# Patient Record
Sex: Male | Born: 1980 | Race: White | Hispanic: No | Marital: Married | Smoking: Never smoker
Health system: Southern US, Community
[De-identification: ages and names within clinical notes are randomized; demographics above are authoritative.]

## PROBLEM LIST (undated history)

## (undated) DIAGNOSIS — I10 Essential (primary) hypertension: Secondary | ICD-10-CM

---

## 2015-05-21 ENCOUNTER — Emergency Department (HOSPITAL_COMMUNITY): Payer: BLUE CROSS/BLUE SHIELD

## 2015-05-21 ENCOUNTER — Encounter (HOSPITAL_COMMUNITY): Payer: Self-pay

## 2015-05-21 ENCOUNTER — Emergency Department (HOSPITAL_COMMUNITY)
Admission: EM | Admit: 2015-05-21 | Discharge: 2015-05-21 | Disposition: A | Discharge status: Home / Self Care | Payer: BLUE CROSS/BLUE SHIELD | Attending: Emergency Medicine | Admitting: Emergency Medicine

## 2015-05-21 DIAGNOSIS — R1030 Lower abdominal pain, unspecified: Secondary | ICD-10-CM | POA: Diagnosis present

## 2015-05-21 DIAGNOSIS — I10 Essential (primary) hypertension: Secondary | ICD-10-CM | POA: Diagnosis not present

## 2015-05-21 DIAGNOSIS — Z79899 Other long term (current) drug therapy: Secondary | ICD-10-CM | POA: Diagnosis not present

## 2015-05-21 DIAGNOSIS — N201 Calculus of ureter: Secondary | ICD-10-CM | POA: Insufficient documentation

## 2015-05-21 HISTORY — DX: Essential (primary) hypertension: I10

## 2015-05-21 LAB — CBC WITH DIFFERENTIAL/PLATELET
BASOS PCT: 0 % (ref 0–1)
Basophils Absolute: 0 10*3/uL (ref 0.0–0.1)
EOS ABS: 0 10*3/uL (ref 0.0–0.7)
Eosinophils Relative: 0 % (ref 0–5)
HEMATOCRIT: 45.8 % (ref 39.0–52.0)
Hemoglobin: 15.8 g/dL (ref 13.0–17.0)
Lymphocytes Relative: 8 % — ABNORMAL LOW (ref 12–46)
Lymphs Abs: 1.3 10*3/uL (ref 0.7–4.0)
MCH: 30.2 pg (ref 26.0–34.0)
MCHC: 34.5 g/dL (ref 30.0–36.0)
MCV: 87.6 fL (ref 78.0–100.0)
MONO ABS: 0.8 10*3/uL (ref 0.1–1.0)
MONOS PCT: 5 % (ref 3–12)
Neutro Abs: 15.1 10*3/uL — ABNORMAL HIGH (ref 1.7–7.7)
Neutrophils Relative %: 87 % — ABNORMAL HIGH (ref 43–77)
Platelets: 232 10*3/uL (ref 150–400)
RBC: 5.23 MIL/uL (ref 4.22–5.81)
RDW: 12.4 % (ref 11.5–15.5)
WBC: 17.3 10*3/uL — ABNORMAL HIGH (ref 4.0–10.5)

## 2015-05-21 LAB — URINALYSIS, ROUTINE W REFLEX MICROSCOPIC
BILIRUBIN URINE: NEGATIVE
Glucose, UA: NEGATIVE mg/dL
HGB URINE DIPSTICK: NEGATIVE
KETONES UR: NEGATIVE mg/dL
Leukocytes, UA: NEGATIVE
NITRITE: NEGATIVE
PH: 6 (ref 5.0–8.0)
Protein, ur: NEGATIVE mg/dL
Specific Gravity, Urine: 1.017 (ref 1.005–1.030)
Urobilinogen, UA: 0.2 mg/dL (ref 0.0–1.0)

## 2015-05-21 LAB — I-STAT CHEM 8, ED
BUN: 22 mg/dL — ABNORMAL HIGH (ref 6–20)
CREATININE: 1.1 mg/dL (ref 0.61–1.24)
Calcium, Ion: 1.22 mmol/L (ref 1.12–1.23)
Chloride: 102 mmol/L (ref 101–111)
Glucose, Bld: 143 mg/dL — ABNORMAL HIGH (ref 65–99)
HEMATOCRIT: 49 % (ref 39.0–52.0)
HEMOGLOBIN: 16.7 g/dL (ref 13.0–17.0)
Potassium: 3.8 mmol/L (ref 3.5–5.1)
SODIUM: 140 mmol/L (ref 135–145)
TCO2: 23 mmol/L (ref 0–100)

## 2015-05-21 LAB — HEPATIC FUNCTION PANEL
ALBUMIN: 4.7 g/dL (ref 3.5–5.0)
ALT: 30 U/L (ref 17–63)
AST: 27 U/L (ref 15–41)
Alkaline Phosphatase: 67 U/L (ref 38–126)
BILIRUBIN DIRECT: 0.2 mg/dL (ref 0.1–0.5)
BILIRUBIN INDIRECT: 1 mg/dL — AB (ref 0.3–0.9)
BILIRUBIN TOTAL: 1.2 mg/dL (ref 0.3–1.2)
Total Protein: 7.7 g/dL (ref 6.5–8.1)

## 2015-05-21 LAB — AMYLASE: AMYLASE: 54 U/L (ref 28–100)

## 2015-05-21 LAB — LIPASE, BLOOD: Lipase: 17 U/L — ABNORMAL LOW (ref 22–51)

## 2015-05-21 MED ORDER — ONDANSETRON HCL 4 MG/2ML IJ SOLN
4.0000 mg | Freq: Once | INTRAMUSCULAR | Status: AC
  Administered 2015-05-21: 4 mg via INTRAVENOUS
  Filled 2015-05-21: qty 2

## 2015-05-21 MED ORDER — HYDROMORPHONE HCL 1 MG/ML IJ SOLN
1.0000 mg | Freq: Once | INTRAMUSCULAR | Status: AC
  Administered 2015-05-21: 1 mg via INTRAVENOUS
  Filled 2015-05-21: qty 1

## 2015-05-21 MED ORDER — PROMETHAZINE HCL 25 MG PO TABS: 25.0000 mg | ORAL_TABLET | Freq: Three times a day (TID) | ORAL | Status: AC | PRN

## 2015-05-21 MED ORDER — IOHEXOL 300 MG/ML  SOLN
50.0000 mL | Freq: Once | INTRAMUSCULAR | Status: AC | PRN
  Administered 2015-05-21: 50 mL via ORAL

## 2015-05-21 MED ORDER — SODIUM CHLORIDE 0.9 % IV BOLUS (SEPSIS)
1000.0000 mL | Freq: Once | INTRAVENOUS | Status: AC
  Administered 2015-05-21: 1000 mL via INTRAVENOUS

## 2015-05-21 MED ORDER — IOHEXOL 300 MG/ML  SOLN
100.0000 mL | Freq: Once | INTRAMUSCULAR | Status: AC | PRN
  Administered 2015-05-21: 100 mL via INTRAVENOUS

## 2015-05-21 MED ORDER — KETOROLAC TROMETHAMINE 30 MG/ML IJ SOLN
30.0000 mg | Freq: Once | INTRAMUSCULAR | Status: AC
  Administered 2015-05-21: 30 mg via INTRAVENOUS
  Filled 2015-05-21: qty 1

## 2015-05-21 MED ORDER — TAMSULOSIN HCL 0.4 MG PO CAPS: 0.4000 mg | ORAL_CAPSULE | Freq: Every day | ORAL | Status: AC

## 2015-05-21 MED ORDER — MORPHINE SULFATE (PF) 4 MG/ML IV SOLN
4.0000 mg | Freq: Once | INTRAVENOUS | Status: AC
  Administered 2015-05-21: 4 mg via INTRAVENOUS
  Filled 2015-05-21: qty 1

## 2015-05-21 MED ORDER — ONDANSETRON HCL 4 MG/2ML IJ SOLN
4.0000 mg | INTRAMUSCULAR | Status: AC
  Administered 2015-05-21: 4 mg via INTRAVENOUS
  Filled 2015-05-21: qty 2

## 2015-05-21 MED ORDER — OXYCODONE-ACETAMINOPHEN 5-325 MG PO TABS
1.0000 | ORAL_TABLET | ORAL | Status: AC | PRN
Start: 2015-05-21 — End: ?

## 2015-05-21 NOTE — ED Notes (Signed)
Pt complains of abdominal pain since 1230 last night, small amounts of vomiting, no diarrhea

## 2015-05-21 NOTE — ED Notes (Signed)
Discharge instructions reviewed with patient. He states he is not going to be driving.  He is going to call a cab.  Advised him to go to medical records to obtain records for his MD [place] home [place].

## 2015-05-21 NOTE — Discharge Instructions (Signed)
Return here as needed.  Follow-up with your urologist when you return home

## 2015-05-21 NOTE — ED Provider Notes (Signed)
CSN: [id number]     Arrival date & time 05/21/15  0324 History   First MD Initiated Contact with Patient 05/21/15 [phone]     Chief Complaint  Patient presents with  . Abdominal Pain     (Consider location/radiation/quality/duration/timing/severity/associated sxs/prior Treatment) HPI Patient presents to the emergency department with abdominal pain that started earlier this evening.  Patient states that the pain is mostly lower abdomen that radiates into his back.  He does have nausea and vomiting associated with that.  Patient denies fever, chest pain, shortness of breath, weakness, shortness of breath, headache, blurred vision, fever, dysuria, incontinence, bloody stool, diarrhea, lightheadedness or syncope.  The patient states that he has had a history of kidney stones mainly on the left due to an irregularly shaped kidney.  Patient states he did not take any medications prior to arrival.  Nothing seems make his condition, better or worse Past Medical History  Diagnosis Date  . Hypertension    History reviewed. No pertinent past surgical history. History reviewed. No pertinent family history. Social History  Substance Use Topics  . Smoking status: Never Smoker   . Smokeless tobacco: None  . Alcohol Use: No    Review of Systems  All other systems negative except as documented in the HPI. All pertinent positives and negatives as reviewed in the HPI.   Allergies  Review of patient's allergies indicates no known allergies.  Home Medications   Prior to Admission medications   Medication Sig Start Date End Date Taking? Authorizing Provider  bismuth subsalicylate (PEPTO BISMOL) 262 MG chewable tablet Chew 524 mg by mouth as needed for indigestion.   Yes Historical Provider, MD  calcium carbonate (TUMS - DOSED IN MG ELEMENTAL CALCIUM) 500 MG chewable tablet Chew 1 tablet by mouth daily.   Yes Historical Provider, MD  lisinopril (PRINIVIL,ZESTRIL) 5 MG tablet Take 5 mg by mouth daily.    Yes Historical Provider, MD   BP 130/71 mmHg  Pulse 98  Temp(Src) 97.9 F (36.6 C) (Oral)  Resp 18  Ht 5\' 9"  (1.753 m)  Wt 155 lb (70.308 kg)  BMI 22.88 kg/m2  SpO2 96% Physical Exam  Constitutional: He is oriented to person, place, and time. He appears well-developed and well-nourished. No distress.  HENT:  Head: Normocephalic and atraumatic.  Mouth/Throat: Oropharynx is clear and moist.  Eyes: Pupils are equal, round, and reactive to light.  Neck: Normal range of motion. Neck supple.  Cardiovascular: Normal rate, regular rhythm and normal heart sounds.  Exam reveals no gallop and no friction rub.   No murmur heard. Pulmonary/Chest: Effort normal and breath sounds normal. No respiratory distress.  Musculoskeletal: He exhibits no edema.  Neurological: He is alert and oriented to person, place, and time. He exhibits normal muscle tone. Coordination normal.  Skin: Skin is warm and dry. No rash noted. No erythema.  Nursing note and vitals reviewed.   ED Course  Procedures (including critical care time) Labs Review Labs Reviewed  LIPASE, BLOOD - Abnormal; Notable for the following:    Lipase 17 (*)    All other components within normal limits  CBC WITH DIFFERENTIAL/PLATELET - Abnormal; Notable for the following:    WBC 17.3 (*)    Neutrophils Relative % 87 (*)    Neutro Abs 15.1 (*)    Lymphocytes Relative 8 (*)    All other components within normal limits  URINALYSIS, ROUTINE W REFLEX MICROSCOPIC (NOT AT [facility]) - Abnormal; Notable for the following:    APPearance  CLOUDY (*)    All other components within normal limits  HEPATIC FUNCTION PANEL - Abnormal; Notable for the following:    Indirect Bilirubin 1.0 (*)    All other components within normal limits  I-STAT CHEM 8, ED - Abnormal; Notable for the following:    BUN 22 (*)    Glucose, Bld 143 (*)    All other components within normal limits  AMYLASE    Imaging Review Ct Abdomen Pelvis W Contrast  05/21/2015    CLINICAL DATA:  Mid abdominal pain for 5 hours. Elevated Jezewski blood cell count.  EXAM: CT ABDOMEN AND PELVIS WITH CONTRAST  TECHNIQUE: Multidetector CT imaging of the abdomen and pelvis was performed using the standard protocol following bolus administration of intravenous contrast.  CONTRAST:  OMNIPAQUE IOHEXOL 300 MG/ML  SOLN  COMPARISON:  None.  FINDINGS: Lower chest: The included lung bases are clear. Pectus deformity of the lower thorax.  Liver: Tiny subcentimeter hypodensity in the right hepatic dome. No suspicious solid lesion.  Hepatobiliary: Gallbladder is physiologically distended. No biliary dilatation.  Pancreas: Normal.  Spleen: Normal.  Adrenal glands: No nodule.  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 There is a 3 mm stone just at or beyond the left ureterovesicular junction. Mild hydroureteronephrosis and perinephric stranding on the left. Additional punctate nonobstructing stone is seen in the left kidney. No obstruction involving the right renal moiety.  Stomach/Bowel: Stomach is distended. There are no dilated or thickened small bowel loops. Small volume of stool throughout the colon without colonic wall thickening. The appendix is normal.  Vascular/Lymphatic: No retroperitoneal adenopathy. Abdominal aorta is normal in caliber.  Reproductive: Prostate gland is normal in size.  Bladder: Physiologically distended. Stone just fat the left ureterovesicular junction or passed within the bladder.  Other: No free air, free fluid, or intra-abdominal fluid collection.  Musculoskeletal: There are no acute or suspicious osseous abnormalities. Incidental note of 4 non-rib-bearing lumbar vertebra and transitional lumbosacral anatomy.  IMPRESSION: 1. Left obstructive uropathy secondary to 3 mm stone just at or beyond the ureterovesicular junction. 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 Additional  nonobstructing stone in the left kidney. 2. 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   Electronically Signed   By: Rubye Oaks M.D.   On: 05/21/2015 06:11   I have personally reviewed and evaluated these images and lab results as part of my medical decision-making.    Patient has kidney stone.  He will be treated for this.  The patient is visiting from [place].  We will have him follow-up with his primary doctor and urologist when he returns home.  He is feeling better at this time.  He agrees the plan and all questions were answered  Charlestine Night, PA-C 05/23/15 1535  April Palumbo, MD 05/28/15 [phone]

## 2015-05-21 NOTE — ED Provider Notes (Signed)
MSE was initiated and I personally evaluated the patient and placed orders (if any) at  5:03 AM on May 21, 2015.  34 year old male with a history of hypertension presents to the emergency department for evaluation of abdominal pain. Abdominal pain began at 0030 and has been constant and present in his lower abdomen. He reports that it radiates into his low back b/l. He has had associated nausea and a small amount of emesis. No medications taken PTA. He denies fever, chest pain, SOB, dysuria, hematuria, melena, or hematochezia. His last BM was at the hotel tonight and was normal; patient visiting from out of town for work. No hx of abdominal surgeries. Patient states that he feels as though he needs to urinate, but can't. No hx of kidney stones.  Physical Exam  Constitutional: He is oriented to person, place, and time. He appears well-developed and well-nourished. No distress.  Patient appears uncomfortable.  HENT:  Head: Normocephalic and atraumatic.  Eyes: Conjunctivae and EOM are normal. No scleral icterus.  Neck: Normal range of motion.  Pulmonary/Chest: Effort normal. No respiratory distress.  Respirations even and unlabored. Pectus excavatum.   Abdominal: Soft. He exhibits no distension and no ascites. There is tenderness in the right lower quadrant, suprapubic area and left lower quadrant. There is guarding. There is no rigidity and no rebound.  TTP in the RLQ, LLQ, and suprapubic abdomen. There is voluntary guarding. No masses or peritoneal signs.  Musculoskeletal: Normal range of motion.  Neurological: He is alert and oriented to person, place, and time. He exhibits normal muscle tone. Coordination normal.  Skin: Skin is warm and dry. No rash noted. He is not diaphoretic. No erythema. No pallor.  Psychiatric: He has a normal mood and affect. His behavior is normal.  Nursing note and vitals reviewed.  0507 - Will further evaluate with CT scan. Leukocytosis of 17.3 suggestive of  infection. UA negative for hematuria and kidney function normal making kidney stone less likely. IVF, morphine, and zofran ordered for symptoms management.  The patient appears stable so that the remainder of the MSE may be completed by another provider.  Antony Madura, PA-C 05/21/15 5621  April Palumbo, MD 05/21/15 0530

## 2016-08-22 IMAGING — CT CT ABD-PELV W/ CM
2 of 4 series · 15 of 46 positions shown, 17 images · IV contrast (100 ML OMNI 300)
Comparison: None.

CLINICAL DATA: Mid abdominal pain for 5 hours. Elevated white blood
cell count.

EXAM:
CT ABDOMEN AND PELVIS WITH CONTRAST
TECHNIQUE: Multidetector CT imaging of the abdomen and pelvis was performed
using the standard protocol following bolus administration of
intravenous contrast.
CONTRAST:  100mL OMNIPAQUE IOHEXOL 300 MG/ML  SOLN

[Series 2: abd/pel with · axial · 0.71mm/px · z∈[+1169,+1579]mm · 12 of 94 slices shown, 14 images]
[im 8/94  soft-tissue]
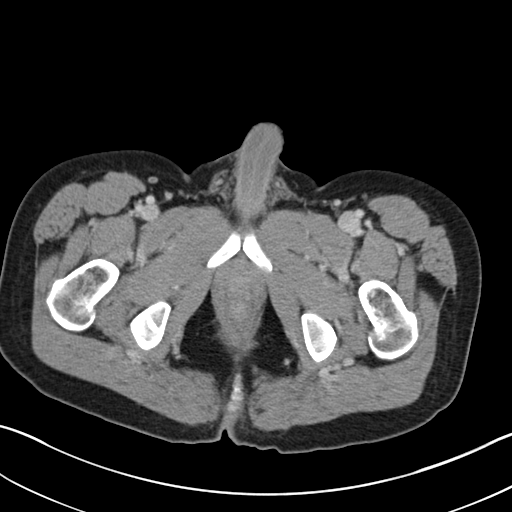
[im 8/94  bone]
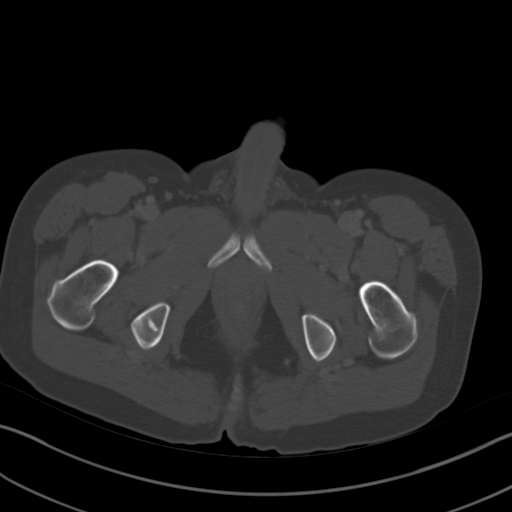
[im 15/94  soft-tissue]
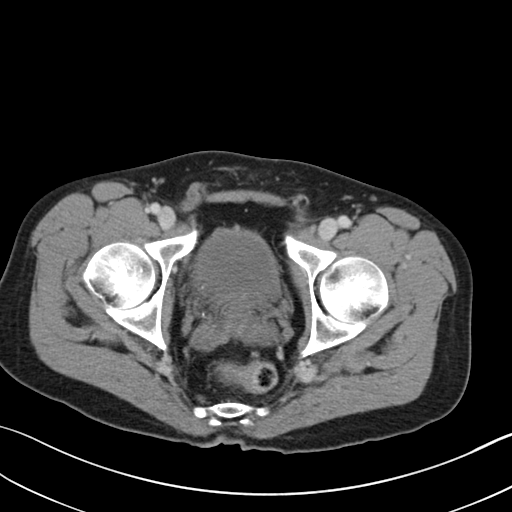
[im 23/94  soft-tissue]
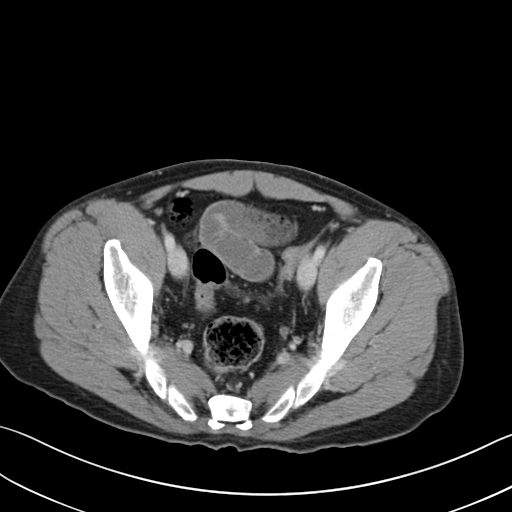
[im 30/94  soft-tissue]
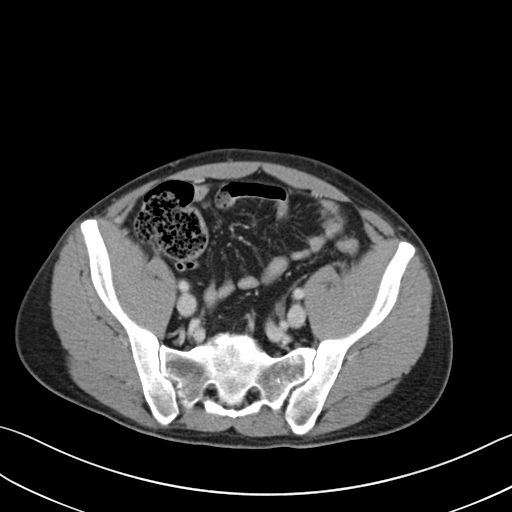
[im 38/94  soft-tissue]
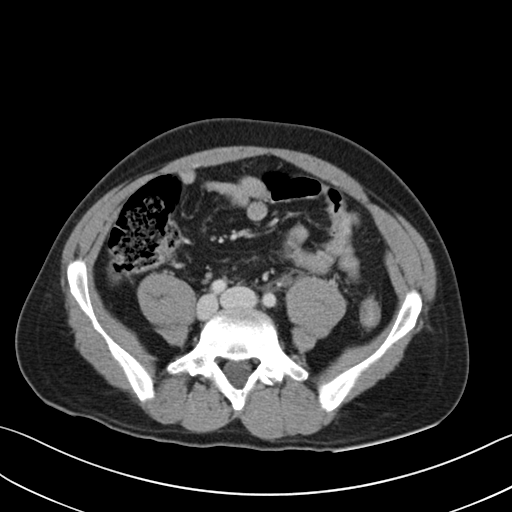
[im 45/94  soft-tissue]
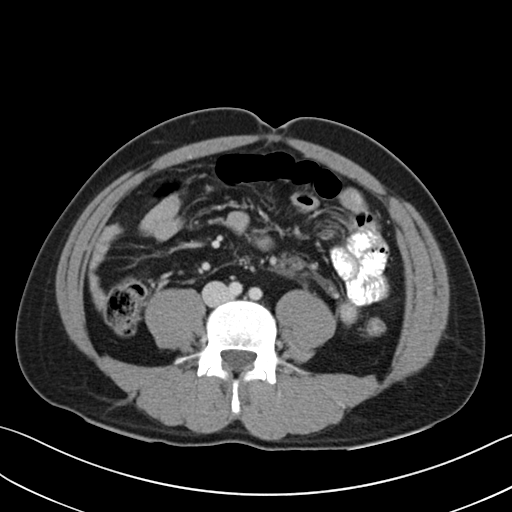
[im 53/94  soft-tissue]
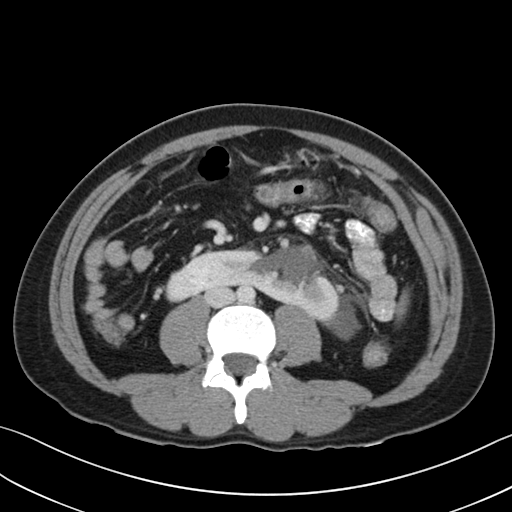
[im 60/94  soft-tissue]
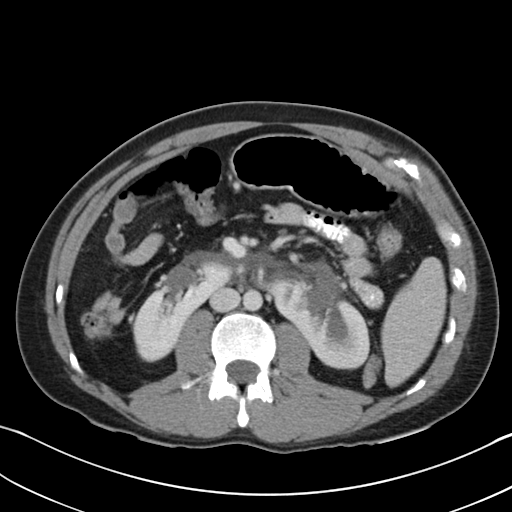
[im 67/94  soft-tissue]
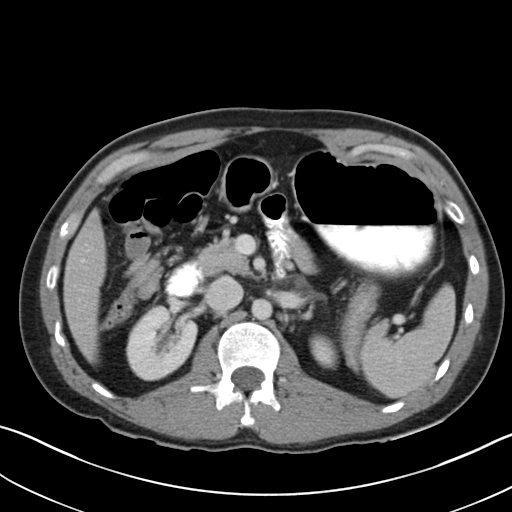
[im 67/94  bone]
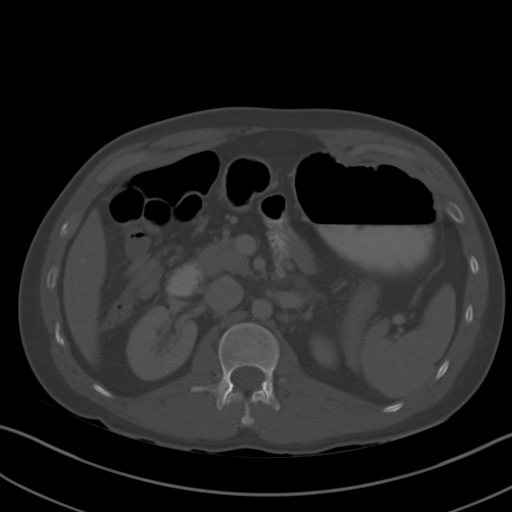
[im 75/94  soft-tissue]
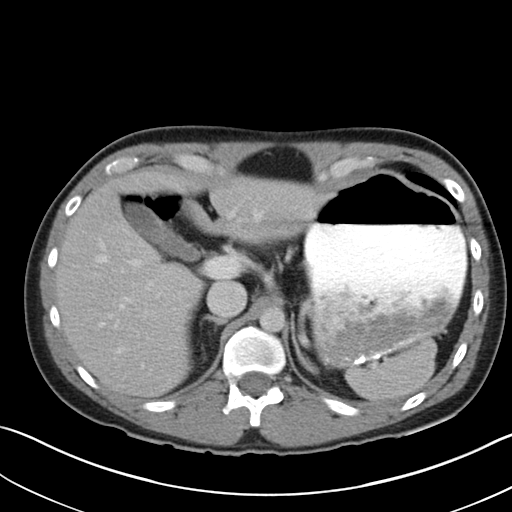
[im 82/94  soft-tissue]
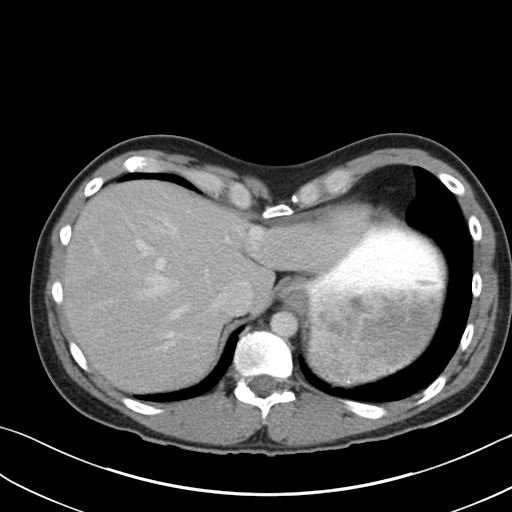
[im 90/94  soft-tissue]
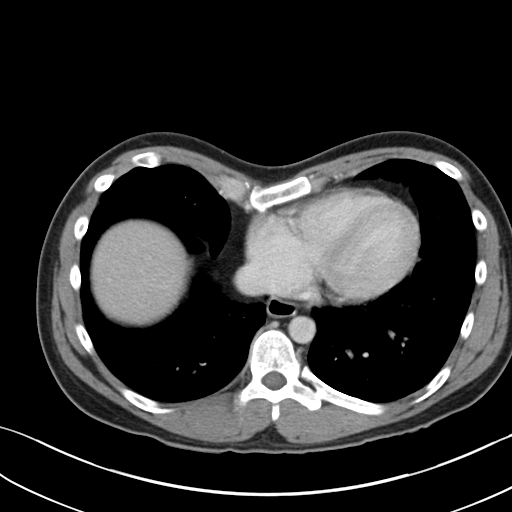

[Series 4: coronal a/|p · coronal · 0.68mm/px · 3 of 81 slices shown]
[im 27/81  soft-tissue]
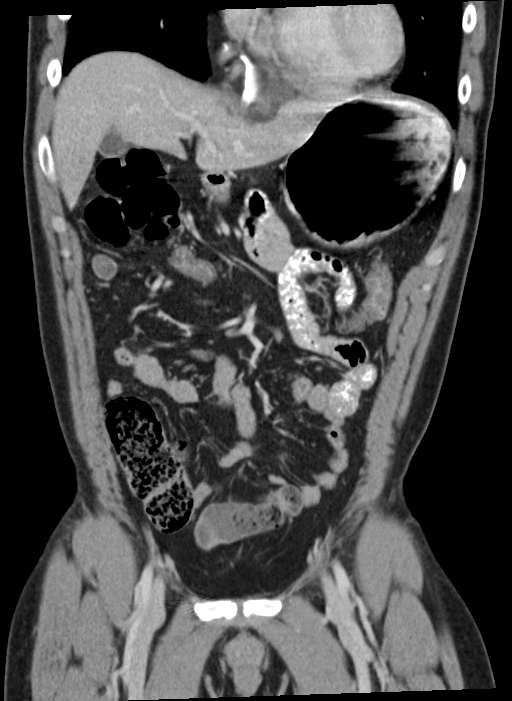
[im 36/81  soft-tissue]
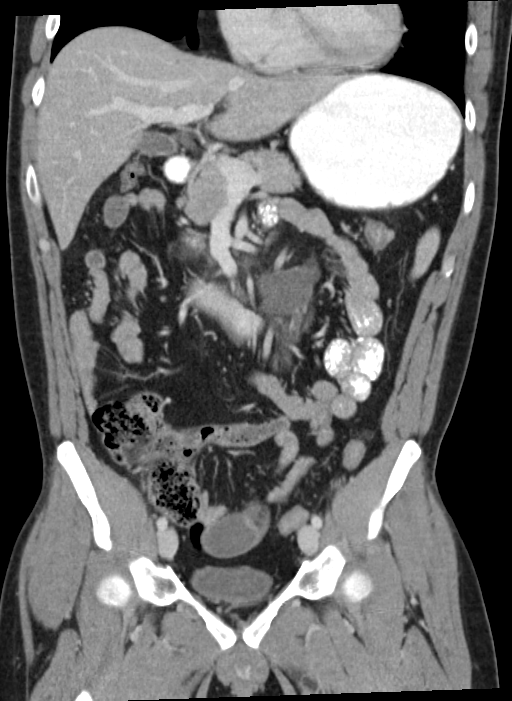
[im 45/81  soft-tissue]
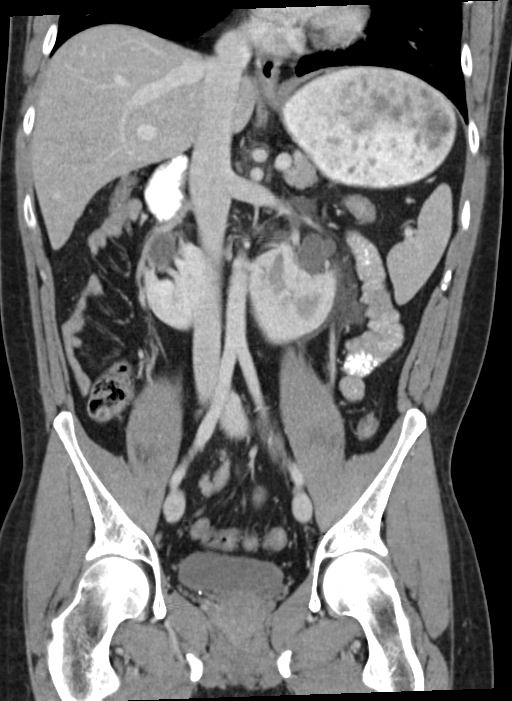

[15 of 46 positions shown; findings below may reference images not displayed]

FINDINGS: Lower chest: The included lung bases are clear. Pectus deformity of
the lower thorax.

Liver: Tiny subcentimeter hypodensity in the right hepatic dome. No
suspicious solid lesion.

Hepatobiliary: Gallbladder is physiologically distended. No biliary
dilatation.

Pancreas: Normal.

Spleen: Normal.

Adrenal glands: No nodule.

Kidneys: Horseshoe kidney configuration with obstructive uropathy
involving the left renal moiety. There is a 3 mm stone just at or
beyond the left ureterovesicular junction. Mild
hydroureteronephrosis and perinephric stranding on the left.
Additional punctate nonobstructing stone is seen in the left kidney.
No obstruction involving the right renal moiety.

Stomach/Bowel: Stomach is distended. There are no dilated or
thickened small bowel loops. Small volume of stool throughout the
colon without colonic wall thickening. The appendix is normal.

Vascular/Lymphatic: No retroperitoneal adenopathy. Abdominal aorta
is normal in caliber.

Reproductive: Prostate gland is normal in size.

Bladder: Physiologically distended. Stone just fat the left
ureterovesicular junction or passed within the bladder.

Other: No free air, free fluid, or intra-abdominal fluid collection.

Musculoskeletal: There are no acute or suspicious osseous
abnormalities. Incidental note of 4 non-rib-bearing lumbar vertebra
and transitional lumbosacral anatomy.
IMPRESSION: 1. Left obstructive uropathy secondary to 3 mm stone just at or
beyond the ureterovesicular junction. There is horseshoe kidney
configuration with hydronephrosis of the left renal moiety.
Additional nonobstructing stone in the left kidney.
2. Pectus excavatum deformity of the lower thorax.
